# Patient Record
Sex: Female | Born: 1995 | Race: Black or African American | Hispanic: No | Marital: Single | Smoking: Never smoker
Health system: Southern US, Community
[De-identification: ages and names within clinical notes are randomized; demographics above are authoritative.]

---
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
## 2015-01-03 ENCOUNTER — Emergency Department: Payer: Medicaid Other

## 2015-01-03 ENCOUNTER — Emergency Department
Admission: EM | Admit: 2015-01-03 | Discharge: 2015-01-04 | Disposition: A | Discharge status: Home / Self Care | Payer: Medicaid Other | Attending: Emergency Medicine | Admitting: Emergency Medicine

## 2015-01-03 DIAGNOSIS — Y998 Other external cause status: Secondary | ICD-10-CM | POA: Diagnosis not present

## 2015-01-03 DIAGNOSIS — S99912A Unspecified injury of left ankle, initial encounter: Secondary | ICD-10-CM | POA: Diagnosis present

## 2015-01-03 DIAGNOSIS — S93402A Sprain of unspecified ligament of left ankle, initial encounter: Secondary | ICD-10-CM | POA: Insufficient documentation

## 2015-01-03 DIAGNOSIS — Y9289 Other specified places as the place of occurrence of the external cause: Secondary | ICD-10-CM | POA: Insufficient documentation

## 2015-01-03 DIAGNOSIS — Y9301 Activity, walking, marching and hiking: Secondary | ICD-10-CM | POA: Insufficient documentation

## 2015-01-03 DIAGNOSIS — W010XXA Fall on same level from slipping, tripping and stumbling without subsequent striking against object, initial encounter: Secondary | ICD-10-CM | POA: Insufficient documentation

## 2015-01-03 NOTE — ED Notes (Signed)
Pt states while [place] the beach [place] ago she twisted her left ankle, pt has swelling to the left ankle and foot. Pt points to her left ankle when asked where her pain is

## 2015-01-04 ENCOUNTER — Encounter: Payer: Self-pay | Admitting: Emergency Medicine

## 2015-01-04 NOTE — Discharge Instructions (Signed)

## 2015-01-04 NOTE — ED Provider Notes (Signed)
[facility] Emergency Department Provider Note  ____________________________________________  Time seen: 00:40  I have reviewed the triage vital signs and the nursing notes.   HISTORY  Chief Complaint Ankle Pain     HPI [name] is a 19 y.o. female who was walking on the beach on Friday.  She reports she tripped and developed pain in her left ankle. She reports she had some swelling in the left ankle. She is ambulatory but with discomfort since the fall. Her pain is moderate. She has no ecchymosis or erythema.     History reviewed. No pertinent past medical history.  There are no active problems to display for this patient.   History reviewed. No pertinent past surgical history.  No current outpatient prescriptions on file.  Allergies Fish allergy; Motrin; Peanut oil; and Yeast-related products  No family history on file.  Social History History  Substance Use Topics  . Smoking status: Never Smoker   . Smokeless tobacco: Not on file  . Alcohol Use: No    Review of Systems  Constitutional: Negative for fever. ENT: Negative for sore throat. Cardiovascular: Negative for chest pain. Respiratory: Negative for shortness of breath. Gastrointestinal: Negative for abdominal pain, vomiting and diarrhea. Genitourinary: Negative for dysuria. Musculoskeletal: Pain and mild swelling to the left ankle. See history of present illness Skin: Negative for rash. Neurological: Negative for headaches   10-point ROS otherwise negative.  ____________________________________________   PHYSICAL EXAM:  VITAL SIGNS: ED Triage Vitals  Enc Vitals Group     BP 01/03/15 2232 151/65 mmHg     Pulse Rate 01/03/15 2232 75     Resp 01/03/15 2232 16     Temp 01/03/15 2232 98.1 F (36.7 C)     Temp Source 01/03/15 2232 Oral     SpO2 01/03/15 2232 100 %     Weight 01/03/15 2232 140 lb (63.504 kg)     Height 01/03/15 2232  (1.6 m)     Head Cir --       Peak Flow --      Pain Score 01/03/15 2233 8     Pain Loc --      Pain Edu? --      Excl. in GC? --     Constitutional: Alert and oriented. Well appearing and in no distress. ENT   Head: Normocephalic and atraumatic. Cardiovascular: Normal rate, regular rhythm. Respiratory: Normal respiratory effort without tachypnea. Breath sounds are clear and equal bilaterally. No wheezes/rales/rhonchi. Gastrointestinal: Soft and nontender. No distention.  Back: No muscle spasm, no tenderness, no CVA tenderness. Musculoskeletal: Mild swelling to the lateral malleolus of her left ankle. There is mild tenderness over the malleolus. No deformity. Good range of motion.. Neurologic:  Normal speech and language. No gross focal neurologic deficits are appreciated.  Skin:  Skin is warm, dry. No rash noted. Psychiatric: Mood and affect are normal. Speech and behavior are normal.  ____________________________________________   ____________________________________________    RADIOLOGY  Left ankle  IMPRESSION: No evidence of fracture or dislocation.  ____________________________________________ ____________________________________________   INITIAL IMPRESSION / ASSESSMENT AND PLAN / ED COURSE  Minor sprain of left ankle. Will apply a brace. I counseled her on physical therapy activity at home. I have counseled her on icing the ankle after she has worked. Her work shifts or 4-6 hours. I think this is a reasonable timeframe for her to be on her feet.  ____________________________________________   FINAL CLINICAL IMPRESSION(S) / ED DIAGNOSES  Final diagnoses:  Ankle sprain, left, initial encounter      Darien Ramus, MD 01/04/15 [phone]

## 2016-05-20 ENCOUNTER — Emergency Department
Admission: EM | Admit: 2016-05-20 | Discharge: 2016-05-21 | Disposition: A | Discharge status: Home / Self Care | Payer: Medicaid - Out of State | Attending: Emergency Medicine | Admitting: Emergency Medicine

## 2016-05-20 ENCOUNTER — Emergency Department: Payer: Self-pay

## 2016-05-20 DIAGNOSIS — N3000 Acute cystitis without hematuria: Secondary | ICD-10-CM | POA: Insufficient documentation

## 2016-05-20 NOTE — ED Provider Notes (Signed)
Physician/Midlevel provider first contact with patient: 05/20/16 2347         History     Chief Complaint   Patient presents with   . Dysuria     Per patient, burning and discomfort w/ urination starting today w/ nausea, no vomiting.  Lower abdominal cramping, no pain.  Denies fever.      The history is provided by the patient. No language interpreter was used.   Dysuria   Pain quality:  Burning  Pain severity:  Moderate  Onset quality:  Gradual  Duration:  1 day  Timing:  Constant  Progression:  Unchanged  Chronicity:  New  Ineffective treatments:  None tried  Urinary symptoms: frequent urination and hesitancy    Associated symptoms: nausea    Associated symptoms: no abdominal pain, no fever, no flank pain and no vomiting             History reviewed. No pertinent past medical history.    History reviewed. No pertinent surgical history.    History reviewed. No pertinent family history.    Social  Social History   Substance Use Topics   . Smoking status: Never Smoker   . Smokeless tobacco: Never Used   . Alcohol use No       .     Allergies   Allergen Reactions   . Motrin [Ibuprofen] Swelling   . Peanut Oil Swelling       Home Medications     None on File           Review of Systems   Constitutional: Negative for fever.   Gastrointestinal: Positive for nausea. Negative for abdominal pain and vomiting.   Genitourinary: Positive for dysuria. Negative for flank pain.   All other systems reviewed and are negative.      Physical Exam    BP: 126/77, Heart Rate: 90, Temp: 98.8 F (37.1 C), Resp Rate: 18, SpO2: 99 %, Weight: 66 kg    Physical Exam   Constitutional: She is oriented to person, place, and time. She appears well-developed and well-nourished. No distress.   HENT:   Head: Normocephalic and atraumatic.   Nose: Nose normal.   Mouth/Throat: Oropharynx is clear and moist.   Eyes: Conjunctivae and EOM are normal. Pupils are equal, round, and reactive to light.   Neck: Normal range of motion. Neck supple.    Cardiovascular: Normal rate, regular rhythm, normal heart sounds and intact distal pulses.    Pulmonary/Chest: Effort normal and breath sounds normal. No respiratory distress.   Abdominal: Soft. She exhibits no distension. There is tenderness.   Mild suprapubic TTP  No RLQ TTP  No CVA TTP   Musculoskeletal: Normal range of motion. She exhibits no edema.   Neurological: She is alert and oriented to person, place, and time. No cranial nerve deficit. She exhibits normal muscle tone. Coordination normal.   Skin: Skin is warm and dry. No rash noted.   Psychiatric: She has a normal mood and affect. Her behavior is normal. Judgment normal.   Nursing note and vitals reviewed.        MDM and ED Course     ED Medication Orders     Start Ordered     Status Ordering Provider    05/21/16 0004 05/21/16 0004  acetaminophen (TYLENOL) tablet 1,000 mg  Once     Route: Oral  Ordered Dose: 1,000 mg     Last MAR action:  Given Bobbye Petti G  05/21/16 0004 05/21/16 0004  phenazopyridine (PYRIDIUM) tablet 200 mg  Once     Route: Oral  Ordered Dose: 200 mg     Last MAR action:  Given Harvy Riera G    05/21/16 0004 05/21/16 0004  cephalexin (KEFLEX) capsule 500 mg  Once     Route: Oral  Ordered Dose: 500 mg     Last MAR action:  Given Akshat Minehart G             MDM  Number of Diagnoses or Management Options  Acute cystitis without hematuria:   Diagnosis management comments: BP 126/77   Pulse 90   Temp 98.8 F (37.1 C) (Oral)   Resp 18   Ht 5\' 3"  (1.6 m)   Wt 66 kg   LMP 05/15/2016   SpO2 99%   BMI 25.77 kg/m     HDS, afebrile w/ exam, history and UA c/w UTI.  PO tolerant and well appearing.  No significant abdominal tenderness on exam.  Will send UCx and d/c with Rx and follow up.    Results for orders placed or performed during the hospital encounter of 05/20/16  -UA with reflex to micro (pts 3 + yrs)       Result                                            Value                         Ref Range                        Urine Type                                        Clean Catch                                                   Color, UA                                         Amber (A)                     Clear - Yellow                  Clarity, UA                                       Clear                         Clear - Hazy                    Specific Gravity UA  1.025                         1.001 - 1.035                   Urine pH                                          6.0                           5.0 - 8.0                       Leukocyte Esterase, UA                            LARGE (A)                     Negative                        Nitrite, UA                                       POSITIVE (A)                  Negative                        Protein, UR                                       >=500 (A)                     Negative                        Glucose, UA                                       NEGATIVE                      Negative                        Ketones UA                                        NEGATIVE                      Negative                        Urobilinogen, UA                                  4.0 (A)  0.2 - 2.0 mg/dL                 Bilirubin, UA                                     NEGATIVE                      Negative                        Blood, UA                                         LARGE (A)                     Negative                   -Urine HCG, Qualitative       Result                                            Value                         Ref Range                       Urine HCG Qualitative                             Negative                      Negative                   -Microscopic, Urine       Result                                            Value                         Ref Range                       RBC, UA                                           3 - 5                         0 - 5 /hpf                       WBC, UA  26 - 50 (A)                   0 - 5 /hpf                      Squamous Epithelial Cells, Urine                  26 - 50 (A)                   0 - 25 /hpf                  D/W patient ED course, treatment plan, lab/imaging results, discharge instructions and return precautions prior to discharge.  Patient understands and agrees with plan of care.         Amount and/or Complexity of Data Reviewed  Clinical lab tests: reviewed    Patient Progress  Patient progress: improved        ED Course              Procedures    Clinical Impression & Disposition     Clinical Impression  Final diagnoses:   Acute cystitis without hematuria        ED Disposition     ED Disposition Condition Date/Time Comment    Discharge  Sun May 21, 2016 12:49 AM Lysle Morales discharge to home/self care.    Condition at disposition: Stable           New Prescriptions    CEPHALEXIN (KEFLEX) 500 MG CAPSULE    Take 1 capsule (500 mg total) by mouth 4 (four) times daily.for 7 days    ONDANSETRON (ZOFRAN-ODT) 4 MG DISINTEGRATING TABLET    Take 1 tablet (4 mg total) by mouth every 6 (six) hours as needed for Nausea.    PHENAZOPYRIDINE (PYRIDIUM) 200 MG TABLET    Take 1 tablet (200 mg total) by mouth 3 (three) times daily as needed (for pain or burning during urination).for up to 6 doses    TRAMADOL (ULTRAM) 50 MG TABLET    Take 1 tablet (50 mg total) by mouth every 6 (six) hours as needed for Pain.for up to 20 doses Do not drive or operate machinery                 Windell Hummingbird, DO  05/21/16 [phone]

## 2016-05-21 LAB — URINALYSIS
Bilirubin, UA: NEGATIVE
Glucose, UA: NEGATIVE
Ketones UA: NEGATIVE
Nitrite, UA: POSITIVE — AB
Protein, UR: >=500 — AB
Specific Gravity UA: 1.025 (ref 1.001–1.035)
Urine pH: 6 (ref 5.0–8.0)
Urobilinogen, UA: 4 mg/dL — AB

## 2016-05-21 LAB — URINE MICROSCOPIC

## 2016-05-21 LAB — URINE HCG QUALITATIVE: Urine HCG Qualitative: NEGATIVE

## 2016-05-21 MED ORDER — CEPHALEXIN 500 MG PO CAPS
500.0000 mg | ORAL_CAPSULE | Freq: Four times a day (QID) | ORAL | 0 refills | Status: AC
Start: 2016-05-21 — End: 2016-05-28

## 2016-05-21 MED ORDER — TRAMADOL HCL 50 MG PO TABS
50.0000 mg | ORAL_TABLET | Freq: Four times a day (QID) | ORAL | 0 refills | Status: DC | PRN
Start: 2016-05-21 — End: 2017-03-17

## 2016-05-21 MED ORDER — PHENAZOPYRIDINE HCL 200 MG PO TABS
200.0000 mg | ORAL_TABLET | Freq: Once | ORAL | Status: AC
Start: 2016-05-21 — End: 2016-05-21
  Administered 2016-05-21: 200 mg via ORAL
  Filled 2016-05-21: qty 1

## 2016-05-21 MED ORDER — CEPHALEXIN 500 MG PO CAPS
500.0000 mg | ORAL_CAPSULE | Freq: Once | ORAL | Status: AC
Start: 2016-05-21 — End: 2016-05-21
  Administered 2016-05-21: 500 mg via ORAL
  Filled 2016-05-21: qty 1

## 2016-05-21 MED ORDER — ONDANSETRON 4 MG PO TBDP
4.0000 mg | ORAL_TABLET | Freq: Four times a day (QID) | ORAL | 0 refills | Status: DC | PRN
Start: 2016-05-21 — End: 2017-03-17

## 2016-05-21 MED ORDER — PHENAZOPYRIDINE HCL 200 MG PO TABS
200.0000 mg | ORAL_TABLET | Freq: Three times a day (TID) | ORAL | 0 refills | Status: DC | PRN
Start: 2016-05-21 — End: 2017-03-17

## 2016-05-21 MED ORDER — ACETAMINOPHEN 500 MG PO TABS
1000.0000 mg | ORAL_TABLET | Freq: Once | ORAL | Status: AC
Start: 2016-05-21 — End: 2016-05-21
  Administered 2016-05-21: 1000 mg via ORAL
  Filled 2016-05-21: qty 2

## 2016-05-21 NOTE — Discharge Instructions (Signed)
Urinary Tract Infection    You have been diagnosed with a lower urinary tract infection (UTI). This is also called cystitis.    Cystitis is an infection in your bladder. Your doctor diagnosed it by testing your urine. Cystitis usually causes burning with urination or frequent urination. It might make you feel like you have to urinate even when you don't.     Cystitis is usually treated with antibiotics and medicine to help with pain.    It is VERY IMPORTANT that you fill your prescription and take all of the antibiotics as directed. If a lower urinary tract infection goes untreated for too long, it can become a kidney infection.    FOR WOMEN: To reduce the risk of getting cystitis again:   Always urinate before and after sexual intercourse.   Always wipe from front to back after urinating or having a bowel movement. Do not wipe from back to front.   Drink plenty of fluids. Try to drink cranberry or blueberry juice. These juices have a chemical that stops bacteria from "sticking" to the bladder.    YOU SHOULD SEEK MEDICAL ATTENTION IMMEDIATELY, EITHER HERE OR AT THE NEAREST EMERGENCY DEPARTMENT, IF ANY OF THE FOLLOWING OCCURS:   You have a fever (temperature higher than 100.4F / 38C) or shaking chills.   You feel nauseated or vomit.   You have pain in your side or back.   You don't get better after taking all of your antibiotics.   You have any new symptoms or concerns.   You feel worse or do not improve.

## 2016-05-21 NOTE — ED Notes (Signed)
Pt discharged awake alert not in any distress.Pt instructed to to follow up to PCP and reviewed all the medication and we;ll understood

## 2016-05-21 NOTE — ED Triage Notes (Signed)
Pt stated that she started having pain on urination and tonight claimed she is urinating with stinge of blood denies any nausea no vomiting and fever

## 2017-03-17 ENCOUNTER — Emergency Department
Admission: EM | Admit: 2017-03-17 | Discharge: 2017-03-17 | Disposition: A | Discharge status: Home / Self Care | Payer: Medicaid HMO | Attending: Emergency Medicine | Admitting: Emergency Medicine

## 2017-03-17 DIAGNOSIS — R1030 Lower abdominal pain, unspecified: Secondary | ICD-10-CM

## 2017-03-17 DIAGNOSIS — N898 Other specified noninflammatory disorders of vagina: Secondary | ICD-10-CM | POA: Insufficient documentation

## 2017-03-17 DIAGNOSIS — N3090 Cystitis, unspecified without hematuria: Secondary | ICD-10-CM | POA: Insufficient documentation

## 2017-03-17 LAB — URINALYSIS
Bilirubin, UA: NEGATIVE
Blood, UA: NEGATIVE
Glucose, UA: NEGATIVE
Ketones UA: NEGATIVE
Nitrite, UA: NEGATIVE
Protein, UR: NEGATIVE
Specific Gravity UA: 1.01 (ref 1.001–1.035)
Urine pH: 7 (ref 5.0–8.0)
Urobilinogen, UA: <2 mg/dL

## 2017-03-17 LAB — URINE HCG QUALITATIVE: Urine HCG Qualitative: NEGATIVE

## 2017-03-17 LAB — URINE MICROSCOPIC: RBC, UA: 0 /hpf (ref 0–5)

## 2017-03-17 MED ORDER — METRONIDAZOLE 500 MG PO TABS
500.0000 mg | ORAL_TABLET | Freq: Two times a day (BID) | ORAL | 0 refills | Status: AC
Start: 2017-03-17 — End: 2017-03-24

## 2017-03-17 MED ORDER — CEPHALEXIN 500 MG PO CAPS
500.0000 mg | ORAL_CAPSULE | Freq: Three times a day (TID) | ORAL | 0 refills | Status: AC
Start: 2017-03-17 — End: 2017-03-24

## 2017-03-17 MED ORDER — CEPHALEXIN 500 MG PO CAPS
500.0000 mg | ORAL_CAPSULE | Freq: Once | ORAL | Status: AC
Start: 2017-03-17 — End: 2017-03-17
  Administered 2017-03-17: 500 mg via ORAL
  Filled 2017-03-17: qty 1

## 2017-03-17 MED ORDER — METRONIDAZOLE 250 MG PO TABS
500.0000 mg | ORAL_TABLET | Freq: Once | ORAL | Status: AC
Start: 2017-03-17 — End: 2017-03-17
  Administered 2017-03-17: 500 mg via ORAL
  Filled 2017-03-17: qty 2

## 2017-03-17 MED ORDER — ACETAMINOPHEN 325 MG PO TABS
650.0000 mg | ORAL_TABLET | Freq: Once | ORAL | Status: AC
Start: 2017-03-17 — End: 2017-03-17
  Administered 2017-03-17: 650 mg via ORAL
  Filled 2017-03-17: qty 2

## 2017-03-17 NOTE — Discharge Instructions (Signed)
Vaginal Discharge    You have been diagnosed with a vaginal discharge.    Discharge (drainage) from the vagina often means there is an inflammation in the vagina. This is called "vaginitis." Some other vaginitis symptoms are: Itching, burning and odor. Urinating (peeing) may be uncomfortable.    The most common causes were checked during the evaluation today. However, other possible causes may need further evaluation by your family doctor or gynecologist.    Vaginal discharge and vaginitis have many causes. Some are:   Hormonal changes from menopause in older women.   Yeast Infections (Candida vaginitis): This happens when yeast normally living in the vagina grows out of control. The normal (good) bacteria living in the vagina can be killed or weakened. This allows the yeast to overgrow. This can happen if you used an antibiotic recently. Recent illness and long-term diabetes can weaken the immune system. This means your body may not be able to keep the yeast under control.   Nonspecific vaginitis: This results from abnormal bacterial overgrowth of the bacteria that usually live in the vagina. This may be caused by recent illness. It can also be caused by some medicines and douching. Gardnerella vaginitis (also called "Bacterial Vaginosis") is a common infection that may result from overgrowth. Rarely, Gardnerella may be a sexually transmitted disease.   Sexually Transmitted Diseases: Some STDs cause a lot of frothy discharge that smells very bad and is green. Trichomoniasis ("trich") is one such STD. Chlamydia can also cause discharge. However, it may have little or no symptoms of which you are aware. Chlamydia may cause a severe (serious) disease called Pelvic Inflammatory Disease (PID). It may also cause the reproductive organs to scar, which causes chronic (ongoing) pelvic pain and infertility (the inability to have babies). Gonorrhea "GC" may cause heavy purulent (pus-like) discharge. It can also cause  PID. Most STDs pass easily between sexual partners. You and your sexual partner(s) would need to be treated.   Viral vaginitis: Herpes Simplex is often the cause. This causes a thin, watery discharge. It is linked with painful blisters on the vagina and swelling of the vaginal lips. Urination and sexual intercourse (sex) may be painful. It is a very infectious STD. Symptoms can be treated but there is no cure.   Allergic vaginitis: The vagina gets irritated from coming into contact with certain substances. These include latex condoms, spermicidal jellies, soaps and bubble baths. It also includes vaginal douche products.    If it is confirmed or suspected that you have an STD, tell your sexual partner(s). Then your partner can be checked by a doctor or the local health department.    To protect yourself and your sexual partner from STDs, use a condom during intercourse (sex), especially if your diagnosis is uncertain and you are waiting for the results of pelvic cultures done during this visit.    The health care provider who saw you feels it is OK to send you home.    You may need to return here or go to the nearest Emergency Department if more symptoms develop. This might mean that you are having complications like worsening infection or bleeding. There could also be some other undiagnosed problem.    It is important to have a follow-up. See your gynecologist or family doctor in the next few days. The medical staff may have told your doctor about this visit. If not, be sure to do so yourself.   If you don't have the right follow-up care available,   tell members of the medical staff before you go. They can help make arrangements for you.    YOU SHOULD SEEK MEDICAL ATTENTION IMMEDIATELY, EITHER HERE OR AT THE NEAREST EMERGENCY DEPARTMENT, IF ANY OF THE FOLLOWING OCCURS:   Increasing pain in the abdomen (belly), pelvis or back.   Large amounts of vaginal discharge or bleeding, passing large blood  clots.   Fever (temperature higher than 100.4F / 38C), chills, nausea, vomiting (throwing up).   You are dizzy, lightheaded, or pass out.             Urinary Tract Infection, Lower    You have been diagnosed with an uncomplicated lower urinary tract infection (UTI).     A UTI is an infection in your bladder. Your doctor diagnosed it by testing your urine. UTIs usually causes burning with urination (peeing) or frequent urination. It might make you feel like you have to urinate even when you don't.     UTI is usually treated with antibiotics and medicine to help with pain.    It is VERY IMPORTANT that you fill your prescription and take all of the antibiotics as directed. If a lower urinary tract infection goes untreated for too long, it can become a kidney infection.    FOR WOMEN: To reduce the risk of getting UTIs again:   Always urinate before and after sexual intercourse.   Always wipe from front to back after urinating or having a bowel movement. Do not wipe from back to front.   Drink plenty of fluids. Try to drink cranberry or blueberry juice. These juices have a chemical that stops bacteria from sticking to the bladder.    YOU SHOULD SEEK MEDICAL ATTENTION IMMEDIATELY, EITHER HERE OR AT THE NEAREST EMERGENCY DEPARTMENT, IF ANY OF THE FOLLOWING OCCURS:   You have a fever (temperature higher than 100.4F / 38C) or shaking chills.   You feel nauseated or vomit.   You have pain in your side or back.   You don't get better after taking all of your antibiotics.   You have any new symptoms or concerns.   You feel worse or do not improve.    If you can't follow up with your doctor, or if at any time you feel you need to be rechecked or seen again, come back here or go to the nearest emergency department.

## 2017-03-17 NOTE — ED Triage Notes (Signed)
Patient c/o abdominal pain onset yesterday. Patient denies N/V with regular bowel movement.

## 2017-03-17 NOTE — ED Provider Notes (Signed)
EMERGENCY DEPARTMENT NOTE    Physician/Midlevel provider first contact with patient: 03/17/17 1030         HISTORY OF PRESENT ILLNESS   Historian:Patient  Translator Used: No    Chief Complaint: Abdominal Pain       21 y.o. female hx of UTI who presents with vaginal discharge and lower abdominal discomfort. Pt reports onset of symptoms was yesterday. Pt reports no dysuria. Noted lower abdominal intermittent discomfort. No nausea or vomiting. Noted vaginal discharge. Reports she is sexually active. Prior hx of BV. Does not use protection. LMP was 8/15.      1. Location of symptoms: GU  2. Onset of symptoms: yesterday   3. What was patient doing when symptoms started (Context): see above  4. Severity: moderate  5. Timing: constant   6. Activities that worsen symptoms: n/a  7. Activities that improve symptoms: n/a  8. Quality: aching, discharge   9. Radiation of symptoms: no  10. Associated signs and Symptoms: see above  11. Are symptoms worsening? yes  MEDICAL HISTORY     Past Medical History:  History reviewed. No pertinent past medical history.    Past Surgical History:  History reviewed. No pertinent surgical history.    Social History:  Social History     Social History   . Marital status: Single     Spouse name: N/A   . Number of children: N/A   . Years of education: N/A     Occupational History   . Not on file.     Social History Main Topics   . Smoking status: Never Smoker   . Smokeless tobacco: Never Used   . Alcohol use No   . Drug use: No   . Sexual activity: Not on file     Other Topics Concern   . Not on file     Social History Narrative   . No narrative on file       Family History:  History reviewed. No pertinent family history.    Outpatient Medication:  Discharge Medication List as of 03/17/2017 11:08 AM            REVIEW OF SYSTEMS   Review of Systems  Constitutional: Negative for fever and chills.   HENT: Negative for congestion and sore throat.  Eyes: Negative for eye discharge and eye redness.    Respiratory: Negative for cough and sputum production.    Gastrointestinal: Positive for lower abdominal pain   Genitourinary:  Positive for vaginal discharge   Neurological: Negative for dizziness, focal weakness, numbness.   All other systems negative.  PHYSICAL EXAM     ED Triage Vitals [03/17/17 1037]   Enc Vitals Group      BP       Pulse       Resp       Temp       Temp src       SpO2       Weight       Height       Head Circumference       Peak Flow       Pain Score 7      Pain Loc       Pain Edu?       Excl. in GC?        Constitutional: Vital signs reviewed. Well appearing, well hydrated, well perfused, non-toxic appearing, no apparent distress  Head:  Normocephalic, atraumatic  Eyes: PERRL, normal conjunctiva  bilaterally, EOMI  ENT: Mucous membranes moist.  .  Neck: Normal range of motion. Non-tender.   Respiratory/Chest: clear to auscultation. No work of breathing. No tachypnea..  Cardiovascular: Regular rate and rhythm. No murmur.   Abdomen: tenderness in the lower abdomen to deep palpation, no rebound.Marland Kitchen  UpperExtremity: No edema or cyanosis.  Neurological: Awake and alert. No focal motor deficits by observation.  Skin: Warm and dry. No rash.  Lymphatic: No cervical lymphadenopathy.    MEDICAL DECISION MAKING       21 y.o. female hx of UTI who presents with vaginal discharge and lower abdominal discomfort.      Discussed testing for STIs. Pt elected for treatment for BV     Urine gc/chlamydia sent   rx for keflex for UTI, urine cx sent     Outpatient follow-up with primary physician         DISCUSSION        Vital Signs: Reviewed the patient?s vital signs.   Nursing Notes: Reviewed and utilized available nursing notes.  Medical Records Reviewed: Reviewed available past medical records.  Counseling: The emergency provider has spoken with the patient and discussed today?s findings, in addition to providing specific details for the plan of care.  Questions are answered and there is agreement with the  plan.      CARDIAC STUDIES    The following cardiac studies were independently interpreted by the Emergency Medicine Physician.  For full cardiac study results please see chart.    Monitor Strip  Interpreted by ED Physician  Rate: 80  Rhythm: NSR   ST Changes: none    IMAGING STUDIES    The following imaging studies were independently interpreted by the Emergency Medicine Physician.  For full imaging study results please see chart.      PULSE OXIMETRY    Oxygen Saturation by Pulse Oximetry: 100%  Interventions: none  Interpretation:  Normal     EMERGENCY DEPT. MEDICATIONS      ED Medication Orders     Start Ordered     Status Ordering Provider    03/17/17 1108 03/17/17 1107  acetaminophen (TYLENOL) tablet 650 mg  Once     Route: Oral  Ordered Dose: 650 mg     Last MAR action:  Given ADJEI-TWUM, Mikesha Migliaccio    03/17/17 1106 03/17/17 1105  metroNIDAZOLE (FLAGYL) tablet 500 mg  Once     Route: Oral  Ordered Dose: 500 mg     Last MAR action:  Given ADJEI-TWUM, Asyah Candler    03/17/17 1105 03/17/17 1104  cephalexin (KEFLEX) capsule 500 mg  Once     Route: Oral  Ordered Dose: 500 mg     Last MAR action:  Given ADJEI-TWUM, Emelie Newsom          LABORATORY RESULTS    Ordered and independently interpreted AVAILABLE laboratory tests. Please see results section in chart for full details.  Results for orders placed or performed during the hospital encounter of 03/17/17   Urinalysis   Result Value Ref Range    Urine Type Clean Catch     Color, UA Straw Clear - Yellow    Clarity, UA Clear Clear - Hazy    Specific Gravity UA 1.010 1.001 - 1.035    Urine pH 7.0 5.0 - 8.0    Leukocyte Esterase, UA LARGE (A) Negative    Nitrite, UA NEGATIVE Negative    Protein, UR NEGATIVE Negative    Glucose, UA NEGATIVE Negative    Ketones UA  NEGATIVE Negative    Urobilinogen, UA <2.0 0.2 - 2.0 mg/dL    Bilirubin, UA NEGATIVE Negative    Blood, UA NEGATIVE Negative   Urine HCG Qualitative   Result Value Ref Range    Urine HCG Qualitative Negative Negative    Microscopic, Urine   Result Value Ref Range    RBC, UA 0 -2 0 - 5 /hpf    WBC, UA 26 - 50 (A) 0 - 5 /hpf    Squamous Epithelial Cells, Urine 11 - 25 0 - 25 /hpf           DIAGNOSIS      Diagnosis:  Final diagnoses:   Cystitis   Lower abdominal pain   Vaginal discharge       Disposition:  ED Disposition     ED Disposition Condition Date/Time Comment    Discharge  Sat Mar 17, 2017 11:08 AM Carla Wood discharge to home/self care.    Condition at disposition: Stable          Prescriptions:  Discharge Medication List as of 03/17/2017 11:08 AM      START taking these medications    Details   cephalexin (KEFLEX) 500 MG capsule Take 1 capsule (500 mg total) by mouth 3 (three) times daily.for 7 days, Starting Sat 03/17/2017, Until Sat 03/24/2017, Normal      metroNIDAZOLE (FLAGYL) 500 MG tablet Take 1 tablet (500 mg total) by mouth 2 (two) times daily.for 7 days, Starting Sat 03/17/2017, Until Sat 03/24/2017, Normal                  Adjei-Twum, Mikey College, MD  03/17/17 1150

## 2017-03-21 LAB — URINE CHLAMYDIA/NEISSERIA BY PCR
Chlamydia DNA by PCR: POSITIVE — AB
Neisseria gonorrhoeae by PCR: NEGATIVE

## 2017-03-22 ENCOUNTER — Telehealth: Payer: Self-pay

## 2017-03-22 NOTE — Telephone Encounter (Signed)
Attempted to call patient to advise her of positive Chlamydia results.  Phone number I called stated that it was not accepting incoming calls.  Will have a certified letter sent to patient.

## 2017-03-23 MED ORDER — DOXYCYCLINE HYCLATE 100 MG PO TABS
100.0000 mg | ORAL_TABLET | Freq: Two times a day (BID) | ORAL | 0 refills | Status: AC
Start: 2017-03-23 — End: 2017-03-30

## 2018-03-10 ENCOUNTER — Other Ambulatory Visit: Payer: Self-pay

## 2018-03-10 ENCOUNTER — Encounter: Payer: Self-pay | Admitting: Emergency Medicine

## 2018-03-10 DIAGNOSIS — M545 Low back pain: Secondary | ICD-10-CM | POA: Insufficient documentation

## 2018-03-10 DIAGNOSIS — Y939 Activity, unspecified: Secondary | ICD-10-CM | POA: Diagnosis not present

## 2018-03-10 DIAGNOSIS — R51 Headache: Secondary | ICD-10-CM | POA: Insufficient documentation

## 2018-03-10 DIAGNOSIS — Z5321 Procedure and treatment not carried out due to patient leaving prior to being seen by health care provider: Secondary | ICD-10-CM | POA: Diagnosis not present

## 2018-03-10 DIAGNOSIS — Y998 Other external cause status: Secondary | ICD-10-CM | POA: Insufficient documentation

## 2018-03-10 DIAGNOSIS — Y92410 Unspecified street and highway as the place of occurrence of the external cause: Secondary | ICD-10-CM | POA: Diagnosis not present
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
## 2018-03-10 NOTE — ED Notes (Signed)
Pt ambulated with steady gait to stat registration inquiring about wait time; pt understands she will be taken back as soon as a treatment room is available for her; explained ED procedure and she verbalized understanding;

## 2018-03-11 ENCOUNTER — Emergency Department
Admission: EM | Admit: 2018-03-11 | Discharge: 2018-03-11 | Disposition: A | Discharge status: Home / Self Care | Payer: No Typology Code available for payment source | Source: Home / Self Care | Attending: Emergency Medicine | Admitting: Emergency Medicine

## 2018-03-11 ENCOUNTER — Encounter: Payer: Self-pay | Admitting: Emergency Medicine

## 2018-03-11 ENCOUNTER — Emergency Department
Admission: EM | Admit: 2018-03-11 | Discharge: 2018-03-11 | Disposition: A | Discharge status: Home / Self Care | Payer: No Typology Code available for payment source | Attending: Emergency Medicine | Admitting: Emergency Medicine

## 2018-03-11 ENCOUNTER — Emergency Department: Payer: No Typology Code available for payment source

## 2018-03-11 ENCOUNTER — Other Ambulatory Visit: Payer: Self-pay

## 2018-03-11 DIAGNOSIS — S20211A Contusion of right front wall of thorax, initial encounter: Secondary | ICD-10-CM

## 2018-03-11 DIAGNOSIS — Y9389 Activity, other specified: Secondary | ICD-10-CM | POA: Insufficient documentation

## 2018-03-11 DIAGNOSIS — S161XXA Strain of muscle, fascia and tendon at neck level, initial encounter: Secondary | ICD-10-CM

## 2018-03-11 DIAGNOSIS — Y92410 Unspecified street and highway as the place of occurrence of the external cause: Secondary | ICD-10-CM

## 2018-03-11 DIAGNOSIS — Y999 Unspecified external cause status: Secondary | ICD-10-CM

## 2018-03-11 MED ORDER — CYCLOBENZAPRINE HCL 5 MG PO TABS: 5.0000 mg | ORAL_TABLET | Freq: Three times a day (TID) | ORAL | 0 refills | Status: AC | PRN

## 2018-03-11 MED ORDER — NAPROXEN 500 MG PO TABS: 500.0000 mg | ORAL_TABLET | Freq: Two times a day (BID) | ORAL | 0 refills | Status: AC

## 2018-03-11 MED ORDER — NAPROXEN 500 MG PO TABS
500.0000 mg | ORAL_TABLET | Freq: Once | ORAL | Status: AC
  Administered 2018-03-11: 500 mg via ORAL
  Filled 2018-03-11: qty 1
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
## 2018-03-11 NOTE — Discharge Instructions (Addendum)
Please take medications as prescribed as needed.  Apply ice to the ribs and neck muscles 20 minutes every hour for the next couple days then transition over to eat.  If no improvement 1 week follow-up with orthopedics.

## 2018-09-27 ENCOUNTER — Emergency Department (HOSPITAL_COMMUNITY)
Admission: EM | Admit: 2018-09-27 | Discharge: 2018-09-28 | Disposition: A | Discharge status: Home / Self Care | Payer: Medicaid - Out of State | Attending: Emergency Medicine | Admitting: Emergency Medicine

## 2018-09-27 ENCOUNTER — Other Ambulatory Visit: Payer: Self-pay

## 2018-09-27 ENCOUNTER — Encounter (HOSPITAL_COMMUNITY): Payer: Self-pay | Admitting: Emergency Medicine

## 2018-09-27 DIAGNOSIS — T783XXA Angioneurotic edema, initial encounter: Secondary | ICD-10-CM | POA: Insufficient documentation

## 2018-09-27 DIAGNOSIS — Z91010 Allergy to peanuts: Secondary | ICD-10-CM | POA: Insufficient documentation

## 2018-09-27 DIAGNOSIS — Z79899 Other long term (current) drug therapy: Secondary | ICD-10-CM | POA: Insufficient documentation

## 2018-09-27 DIAGNOSIS — T7840XA Allergy, unspecified, initial encounter: Secondary | ICD-10-CM

## 2018-09-27 LAB — I-STAT BETA HCG BLOOD, ED (MC, WL, AP ONLY): I-stat hCG, quantitative: <5 m[IU]/mL (ref <5)

## 2018-09-27 MED ORDER — METHYLPREDNISOLONE SODIUM SUCC 125 MG IJ SOLR
125.0000 mg | Freq: Once | INTRAMUSCULAR | Status: AC
  Administered 2018-09-27: 125 mg via INTRAVENOUS
  Filled 2018-09-27: qty 2

## 2018-09-27 MED ORDER — FAMOTIDINE 20 MG IN NS 100 ML IVPB
20.0000 mg | Freq: Once | INTRAVENOUS | Status: AC
  Administered 2018-09-27: 20 mg via INTRAVENOUS
  Filled 2018-09-27: qty 100

## 2018-09-27 NOTE — ED Provider Notes (Signed)
[facility] EMERGENCY DEPARTMENT Provider Note   CSN: [id number] Arrival date & time: 09/27/18  2153    History   Chief Complaint Chief Complaint  Patient presents with   Allergic Reaction    HPI [name] is a 23 y.o. female with no significant past medical history who presents today for evaluation of allergic reaction.  She reports that at around 2 PM she ate at [facility].  She says that she has noticed that when she eats ranch dressing recently she feels slightly itchy after.  She states that she was feeling okay until about 8 PM when she started feeling like her lip was slightly swollen.  At about 930 she also started feeling like her throat was itchy.  She denies shortness of breath or rash.  No nausea vomiting or diarrhea.  She took 325 mg tablets of Benadryl approximately 1 hour prior to arrival. She does not take any blood pressure medications or ACE inhibitors.  No known family history of hereditary angioedema.    HPI  History reviewed. No pertinent past medical history.  There are no active problems to display for this patient.   History reviewed. No pertinent surgical history.   OB History   No obstetric history on file.      Home Medications    Prior to Admission medications   Medication Sig Start Date End Date Taking? Authorizing Provider  cyclobenzaprine (FLEXERIL) 5 MG tablet Take 1-2 tablets (5-10 mg total) by mouth 3 (three) times daily as needed for muscle spasms. 03/11/18   Evon Slack, PA-C  EPINEPHrine 0.3 mg/0.3 mL IJ SOAJ injection Inject one device into the side of the thigh for severe allergic reactions.  May repeat with second device in 5-10 minutes if needed.  After using call 9-11. 09/28/18   Cristina Gong, PA-C  naproxen (NAPROSYN) 500 MG tablet Take 1 tablet (500 mg total) by mouth 2 (two) times daily with a meal. 03/11/18 03/11/19  Evon Slack, PA-C  predniSONE (DELTASONE) 20 MG tablet Take 2 tablets  (40 mg total) by mouth daily for 4 days. 09/28/18 10/02/18  Cristina Gong, PA-C    Family History No family history on file.  Social History Social History   Tobacco Use   Smoking status: Never Smoker   Smokeless tobacco: Never Used  Substance Use Topics   Alcohol use: No   Drug use: Yes    Types: Marijuana    Comment: last smoked yesterday     Allergies   Fish allergy; Motrin [ibuprofen]; Peanut oil; and Yeast-related products   Review of Systems Review of Systems  Constitutional: Negative for chills and fever.  HENT: Positive for facial swelling. Negative for drooling, sore throat, trouble swallowing and voice change.   Respiratory: Negative for cough, chest tightness and shortness of breath.   Cardiovascular: Negative for chest pain.  Gastrointestinal: Negative for abdominal pain, diarrhea, nausea and vomiting.  Neurological: Negative for weakness and headaches.  All other systems reviewed and are negative.    Physical Exam Updated Vital Signs BP 123/87 (BP Location: Right Arm)    Pulse 74    Temp 98.3 F (36.8 C) (Oral)    Resp 16    Ht  (1.6 m)    Wt 63.5 kg    LMP 09/27/2018 (Exact Date)    SpO2 99%    BMI 24.80 kg/m   Physical Exam Vitals signs and nursing note reviewed.  Constitutional:  General: She is not in acute distress.    Appearance: She is well-developed.  HENT:     Head: Normocephalic and atraumatic.     Comments: There is obvious edema of the lower lip.  There is no edema of the upper lip.  Tongue is not swollen, voice is not hoarse or muffled.  She is managing her secretions without difficulty.  No obvious edema to oropharynx.    Nose: Nose normal.     Mouth/Throat:     Mouth: Mucous membranes are moist.     Pharynx: No oropharyngeal exudate or posterior oropharyngeal erythema.  Eyes:     Conjunctiva/sclera: Conjunctivae normal.  Neck:     Musculoskeletal: Normal range of motion and neck supple.  Cardiovascular:     Rate  and Rhythm: Normal rate and regular rhythm.     Pulses: Normal pulses.     Heart sounds: Normal heart sounds. No murmur.  Pulmonary:     Effort: Pulmonary effort is normal. No respiratory distress.     Breath sounds: Normal breath sounds.  Abdominal:     Palpations: Abdomen is soft.     Tenderness: There is no abdominal tenderness.  Musculoskeletal: Normal range of motion.     Right lower leg: No edema.     Left lower leg: No edema.  Lymphadenopathy:     Cervical: No cervical adenopathy.  Skin:    General: Skin is warm and dry.  Neurological:     General: No focal deficit present.     Mental Status: She is alert and oriented to person, place, and time.  Psychiatric:        Mood and Affect: Mood normal.      ED Treatments / Results  Labs (all labs ordered are listed, but only abnormal results are displayed) Labs Reviewed  I-STAT BETA HCG BLOOD, ED ([facility], [facility], [facility] ONLY)    EKG None  Radiology No results found.  Procedures Procedures (including critical care time)  Medications Ordered in ED Medications  methylPREDNISolone sodium succinate (SOLU-MEDROL) 125 mg/2 mL injection 125 mg (125 mg Intravenous Given 09/27/18 2233)  famotidine (PEPCID) IVPB 20 mg in NS 100 mL IVPB ( Intravenous Stopped 09/27/18 2308)     Initial Impression / Assessment and Plan / ED Course  I have reviewed the triage vital signs and the nursing notes.  Pertinent labs & imaging results that were available during my care of the patient were reviewed by me and considered in my medical decision making (see chart for details).  Clinical Course as of Sep 27 512  Fri Sep 27, 2018  2228 Patient re-evaluated, feeling sleepy from medications, reports that her throat is feeling "normal."  No new symptoms, will continue to monitor.   [EH]  2328 Patient re-evaluated, right sided lip swelling is starting to decrease.  No worsening or new symptoms.  In no distress.    [EH]  Sat Sep 28, 2018  0210 Patient  reevaluated, lip swelling is slightly decreased from arrival however still remains swollen.  She denies any new or worsening symptoms.   [EH]    Clinical Course User Index [EH] Cristina Gong, PA-C      Patient presents today for evaluation of lip swelling.  She does not take any ACE inhibitors, and has a history of allergic reactions to fish, and peanuts, so suspect that this is allergic reaction related.  She took 75 mg of Benadryl prior to arrival, therefore was not given any additional Benadryl.  She had significant lip swelling, however no tongue swelling and her airway was intact without secondary system involvement therefore she was not given EpiPen instead closely monitored.  She was treated with IV steroids and H2 inhibitors.  She was observed in the emergency room for over 4 hours without worsening of condition.  Her lip swelling slightly decreased.  Given that this is the first time this has happened to her, and her history of allergies and eating out suspect that this is allergy related rather than familial angioedema.  She is given prescription for continued steroids and prescription for EpiPen.  We went over the appropriate use of EpiPen's.    Return precautions were discussed with patient who states their understanding.  At the time of discharge patient denied any unaddressed complaints or concerns.  Patient is agreeable for discharge home.   Final Clinical Impressions(s) / ED Diagnoses   Final diagnoses:  Angioedema, initial encounter  Allergic reaction, initial encounter    ED Discharge Orders         Ordered    predniSONE (DELTASONE) 20 MG tablet  Daily     09/28/18 0212    EPINEPHrine 0.3 mg/0.3 mL IJ SOAJ injection     09/28/18 1324           Cristina Gong, PA-C 09/28/18 4010    Zadie Rhine, MD 09/28/18 [phone]

## 2018-09-27 NOTE — ED Triage Notes (Signed)
Pt to ED with c/o allergic reaction.  Pt st's she is allergic to a lot of things.   St's she ate at [facility] today approx 2:00 and then ate [employer] earlier tonight.  Pt st's she noticed her bottom lip start to swell.  Pt st's she took 3 Benadryl before coming to ED.  No resp distress present at this time

## 2018-09-28 MED ORDER — EPINEPHRINE 0.3 MG/0.3ML IJ SOAJ: INTRAMUSCULAR | 0 refills | Status: DC

## 2018-09-28 MED ORDER — PREDNISONE 20 MG PO TABS: 40.0000 mg | ORAL_TABLET | Freq: Every day | ORAL | 0 refills | Status: AC

## 2018-09-28 NOTE — Discharge Instructions (Signed)
Today you were seen and evaluated for an allergic reaction.  You were given multiple medications in the emergency room including benadryl (which may make you sleepy) and IV steroids.    If you develop any concerning symptoms, especially feeling like your throat is closing, shortness of breath, swelling of the face, lips or tongue then please return to the emergency room immediately for evaluation.    For the next 24 hours please take 1 pill (25 mg) of benadryl every 6 hours.  If you have mild symptoms such as itching, mild nausea you may take a second benadryl.  This will make you sleepy and you should not drive, operate heavy machinery or perform any task where if you were to fall asleep or make a slow decision it could cause harm to you or someone else.   You have also been given a prescription for 4 days of steroids.  Please take your first dose about 24 hours after you were given steroids in the emergency room.  Steroids may give you the feeling of being hot, increased appetite, and extra energy.    I have given you a prescription for two epi pens.  Please make sure you keep one with you at all times.  Please consider keeping two pills of benadryl with your epi pen and if you use it take the benadryl and call 9-11.  Please do not store epi-pens in your car or allow them to get very hot or cold as this can make them not work as well.

## 2018-09-28 NOTE — ED Notes (Addendum)
Patient verbalizes understanding of medications and discharge instructions. No further questions at this time. VSS and patient ambulatory at discharge.   

## 2019-02-15 ENCOUNTER — Other Ambulatory Visit: Payer: Self-pay

## 2019-02-15 ENCOUNTER — Encounter (HOSPITAL_COMMUNITY): Payer: Self-pay | Admitting: Emergency Medicine

## 2019-02-15 ENCOUNTER — Emergency Department (HOSPITAL_COMMUNITY): Payer: Self-pay

## 2019-02-15 ENCOUNTER — Emergency Department (HOSPITAL_COMMUNITY)
Admission: EM | Admit: 2019-02-15 | Discharge: 2019-02-15 | Disposition: A | Discharge status: Home / Self Care | Payer: Self-pay | Attending: Emergency Medicine | Admitting: Emergency Medicine

## 2019-02-15 DIAGNOSIS — M25562 Pain in left knee: Secondary | ICD-10-CM | POA: Insufficient documentation

## 2019-02-15 DIAGNOSIS — R600 Localized edema: Secondary | ICD-10-CM | POA: Insufficient documentation

## 2019-02-15 DIAGNOSIS — X501XXA Overexertion from prolonged static or awkward postures, initial encounter: Secondary | ICD-10-CM | POA: Insufficient documentation

## 2019-02-15 DIAGNOSIS — Z91010 Allergy to peanuts: Secondary | ICD-10-CM | POA: Insufficient documentation

## 2019-02-15 DIAGNOSIS — Z79899 Other long term (current) drug therapy: Secondary | ICD-10-CM | POA: Insufficient documentation

## 2019-02-15 MED ORDER — DICLOFENAC SODIUM 1 % TD GEL: 2.0000 g | Freq: Four times a day (QID) | TRANSDERMAL | 0 refills | Status: DC

## 2019-02-15 MED ORDER — DICLOFENAC SODIUM 1 % TD GEL: 2.0000 g | Freq: Four times a day (QID) | TRANSDERMAL | 0 refills | Status: AC

## 2019-02-15 NOTE — ED Provider Notes (Signed)
[place] EMERGENCY DEPARTMENT Provider Note   CSN: [id number] Arrival date & time: 02/15/19  0342    History   Chief Complaint Chief Complaint  Patient presents with  . Knee Pain    HPI [name] is a 23 y.o. female with a hx of no major medical problems presents to the Emergency Department complaining of waxing and waning left knee pain and swelling.  She reports symptoms have been present for approximately 2 weeks.  She reports no known injury to her knee.  She does states she fell down some stairs in April and injured her ankle but did not have pain in this knee.  Patient works several jobs which includes a lot of standing on her feet.  She has been taking ibuprofen with minimal relief and started using a knee sleeve today.  Standing for long periods of time does make her pain worse, rest does make it better.  She denies redness, fevers, increased warmth, decreased range of motion, history of STD.      The history is provided by the patient and medical records. No language interpreter was used.    History reviewed. No pertinent past medical history.  There are no active problems to display for this patient.   History reviewed. No pertinent surgical history.   OB History   No obstetric history on file.      Home Medications    Prior to Admission medications   Medication Sig Start Date End Date Taking? Authorizing Provider  cyclobenzaprine (FLEXERIL) 5 MG tablet Take 1-2 tablets (5-10 mg total) by mouth 3 (three) times daily as needed for muscle spasms. 03/11/18   Duanne Guess, PA-C  diclofenac sodium (VOLTAREN) 1 % GEL Apply 2 g topically 4 (four) times daily. 02/15/19   Camil Wilhelmsen, Jarrett Soho, PA-C  EPINEPHrine 0.3 mg/0.3 mL IJ SOAJ injection Inject one device into the side of the thigh for severe allergic reactions.  May repeat with second device in 5-10 minutes if needed.  After using call 9-11. 09/28/18   Lorin Glass, PA-C  naproxen  (NAPROSYN) 500 MG tablet Take 1 tablet (500 mg total) by mouth 2 (two) times daily with a meal. 03/11/18 03/11/19  Duanne Guess, PA-C    Family History No family history on file.  Social History Social History   Tobacco Use  . Smoking status: Never Smoker  . Smokeless tobacco: Never Used  Substance Use Topics  . Alcohol use: No  . Drug use: Yes    Types: Marijuana    Comment: last smoked yesterday     Allergies   Fish allergy, Motrin [ibuprofen], Peanut oil, and Yeast-related products   Review of Systems Review of Systems  Constitutional: Negative for chills and fever.  Gastrointestinal: Negative for nausea and vomiting.  Musculoskeletal: Positive for arthralgias and joint swelling. Negative for back pain, neck pain and neck stiffness.  Skin: Negative for wound.  Neurological: Negative for numbness.  Hematological: Does not bruise/bleed easily.  Psychiatric/Behavioral: The patient is not nervous/anxious.   All other systems reviewed and are negative.    Physical Exam Updated Vital Signs BP 121/83   Pulse 72   Temp 98.5 F (36.9 C) (Oral)   Resp 18   Ht 5\' 3"  (1.6 m)   Wt 72.6 kg   LMP 01/15/2019 (Exact Date) Comment: pt shielded  SpO2 99%   BMI 28.34 kg/m   Physical Exam Vitals signs and nursing note reviewed.  Constitutional:  General: She is not in acute distress.    Appearance: She is well-developed. She is not diaphoretic.  HENT:     Head: Normocephalic and atraumatic.  Eyes:     Conjunctiva/sclera: Conjunctivae normal.  Neck:     Musculoskeletal: Normal range of motion.  Cardiovascular:     Rate and Rhythm: Normal rate and regular rhythm.     Comments: Capillary refill < 3 sec Pulmonary:     Effort: Pulmonary effort is normal.     Breath sounds: Normal breath sounds.  Musculoskeletal:        General: Tenderness present.     Left hip: Normal.     Left knee: She exhibits swelling and effusion. She exhibits normal range of motion, no  ecchymosis, no deformity, no laceration, no erythema, normal alignment and normal patellar mobility. Tenderness ( generalized) found.     Left ankle: Normal.  Skin:    General: Skin is warm and dry.     Comments: No tenting of the skin  Neurological:     Mental Status: She is alert.     Coordination: Coordination normal.     Comments: Sensation intact to normal touch Strength 5/5 in the LLE      ED Treatments / Results   Radiology Dg Knee Complete 4 Views Left  Result Date: 02/15/2019 CLINICAL DATA:  Initial evaluation for acute pain and swelling. EXAM: LEFT KNEE - COMPLETE 4+ VIEW COMPARISON:  None. FINDINGS: No evidence of fracture, dislocation, or joint effusion. No evidence of arthropathy or other focal bone abnormality. Soft tissues are unremarkable. IMPRESSION: Negative. Electronically Signed   By: Rise MuBenjamin  McClintock M.D.   On: 02/15/2019 04:25    Procedures Procedures (including critical care time)  Medications Ordered in ED Medications - No data to display   Initial Impression / Assessment and Plan / ED Course  I have reviewed the triage vital signs and the nursing notes.  Pertinent labs & imaging results that were available during my care of the patient were reviewed by me and considered in my medical decision making (see chart for details).        Patient X-Ray negative for obvious fracture or dislocation.  No clinical evidence of septic joint.  Pt advised to follow up with orthopedics if symptoms persist for further evaluation and treatment. Patient given brace while in ED, conservative therapy recommended and discussed. Patient will be dc home & is agreeable with above plan.  Of note, patient reported allergy to Motrin previously and states she has been taking Ibuprofen this week without side effect.  She reports previously she noticed that her foot would sometimes swell and she would get a yeast infection when she took Motrin.  No difficulty breathing, rash, GI  symptoms.  Long discussion with patient about Voltaren usage and same classification as ibuprofen.  She is to try this medication however if she experiences worsening swelling, rash or any other symptoms she is to stop immediately.  Patient states understanding.   Final Clinical Impressions(s) / ED Diagnoses   Final diagnoses:  Acute pain of left knee    ED Discharge Orders         Ordered    diclofenac sodium (VOLTAREN) 1 % GEL  4 times daily     02/15/19 0519           Gloriann Riede, Dahlia ClientHannah, PA-C 02/15/19 [id number]    Wickline, Donald, MD 02/15/19 [phone]

## 2019-02-15 NOTE — ED Notes (Addendum)
Patient transported to X-ray 

## 2019-02-15 NOTE — Discharge Instructions (Addendum)
1. Medications: Tylenol for pain control, Voltaren as antiinflammatory; usual home medications 2. Treatment: rest, ice, elevate and use brace, drink plenty of fluids, gentle stretching 3. Follow Up: Please followup with orthopedics as directed or your PCP in 1 week if no improvement for discussion of your diagnoses and further evaluation after today's visit; if you do not have a primary care doctor use the resource guide provided to find one; Please return to the ER for worsening symptoms or other concerns

## 2019-02-15 NOTE — ED Triage Notes (Signed)
Pt reports knee pain and swelling for the past 2 weeks.

## 2019-08-21 ENCOUNTER — Emergency Department (HOSPITAL_COMMUNITY): Admission: EM | Admit: 2019-08-21 | Discharge: 2019-08-21 | Discharge status: Against Medical Advice | Payer: Self-pay

## 2019-08-21 ENCOUNTER — Other Ambulatory Visit: Payer: Self-pay

## 2019-08-27 ENCOUNTER — Encounter (HOSPITAL_COMMUNITY): Payer: Self-pay | Admitting: Emergency Medicine

## 2019-08-27 ENCOUNTER — Emergency Department (HOSPITAL_COMMUNITY)
Admission: EM | Admit: 2019-08-27 | Discharge: 2019-08-27 | Disposition: A | Discharge status: Home / Self Care | Payer: Self-pay | Attending: Emergency Medicine | Admitting: Emergency Medicine

## 2019-08-27 DIAGNOSIS — Z91010 Allergy to peanuts: Secondary | ICD-10-CM | POA: Insufficient documentation

## 2019-08-27 DIAGNOSIS — T7840XA Allergy, unspecified, initial encounter: Secondary | ICD-10-CM | POA: Insufficient documentation

## 2019-08-27 MED ORDER — FAMOTIDINE IN NACL 20-0.9 MG/50ML-% IV SOLN
20.0000 mg | Freq: Once | INTRAVENOUS | Status: AC
  Administered 2019-08-27: 06:00:00 20 mg via INTRAVENOUS
  Filled 2019-08-27: qty 50

## 2019-08-27 MED ORDER — EPINEPHRINE 0.3 MG/0.3ML IJ SOAJ
0.3000 mg | Freq: Once | INTRAMUSCULAR | Status: AC
  Administered 2019-08-27: 0.3 mg via INTRAMUSCULAR
  Filled 2019-08-27: qty 0.3

## 2019-08-27 MED ORDER — DIPHENHYDRAMINE HCL 50 MG/ML IJ SOLN
25.0000 mg | Freq: Once | INTRAMUSCULAR | Status: AC
  Administered 2019-08-27: 25 mg via INTRAVENOUS
  Filled 2019-08-27: qty 1

## 2019-08-27 MED ORDER — METHYLPREDNISOLONE SODIUM SUCC 125 MG IJ SOLR
125.0000 mg | Freq: Once | INTRAMUSCULAR | Status: AC
  Administered 2019-08-27: 125 mg via INTRAVENOUS
  Filled 2019-08-27: qty 2

## 2019-08-27 MED ORDER — EPINEPHRINE 0.3 MG/0.3ML IJ SOAJ: 0.3000 mg | INTRAMUSCULAR | 1 refills | Status: AC | PRN

## 2019-08-27 MED ORDER — PREDNISONE 20 MG PO TABS: 40.0000 mg | ORAL_TABLET | Freq: Every day | ORAL | 0 refills | Status: AC

## 2019-08-27 NOTE — ED Provider Notes (Signed)
Patient checked out at 8 AM.  After 4 hours of observation on repeat evaluation bilateral eyelids are still swollen and left lower lip but patient states it is not any worse.  She has no uvular edema or tongue swelling.  Will discharge home with a new EpiPen and steroids.   Gwyneth Sprout, MD 08/27/19 1058

## 2019-08-27 NOTE — ED Triage Notes (Signed)
Patient here from home with complaints of allergic reaction. Facial swelling noted increased in bilateral eyes and lips. Reports "a lot of allergies". States that she thinks that she is allergic to the cold meds she took at 1am.

## 2019-08-27 NOTE — Discharge Instructions (Signed)
Continue to use the liquid you use for your allergies every 6 hours.  Also you can try Ocean Nasal Spray or Saline spray for your congestion.  If the swelling gets worse or you start having any trouble breathing use the EpiPen and return immediately

## 2019-08-27 NOTE — ED Provider Notes (Signed)
[place] DEPT Provider Note   CSN: [id number] Arrival date & time: 08/27/19  1194    History Chief Complaint  Patient presents with  . Allergic Reaction    [name] is a 24 y.o. female.  The history is provided by the patient.  Allergic Reaction She has history of allergies to multiple things including peanut oil, fish, ibuprofen, yeast and comes in because of facial swelling which she feels is likely an allergic reaction.  She states that she took some Alka-Seltzer plus at about 10 PM and went to sleep about 1 AM.  She woke up at 2 AM with her face itching and swollen.  She has taken diphenhydramine liquid at home without any improvement.  Face has gotten progressively more swollen.  She is having some slight difficulty breathing and swallowing.  She did not use her EpiPen.  History reviewed. No pertinent past medical history.  There are no problems to display for this patient.   History reviewed. No pertinent surgical history.   OB History   No obstetric history on file.     No family history on file.  Social History   Tobacco Use  . Smoking status: Never Smoker  . Smokeless tobacco: Never Used  Substance Use Topics  . Alcohol use: No  . Drug use: Yes    Types: Marijuana    Comment: last smoked yesterday    Home Medications Prior to Admission medications   Medication Sig Start Date End Date Taking? Authorizing Provider  cyclobenzaprine (FLEXERIL) 5 MG tablet Take 1-2 tablets (5-10 mg total) by mouth 3 (three) times daily as needed for muscle spasms. 03/11/18   Duanne Guess, PA-C  diclofenac sodium (VOLTAREN) 1 % GEL Apply 2 g topically 4 (four) times daily. 02/15/19   Muthersbaugh, Jarrett Soho, PA-C  EPINEPHrine 0.3 mg/0.3 mL IJ SOAJ injection Inject one device into the side of the thigh for severe allergic reactions.  May repeat with second device in 5-10 minutes if needed.  After using call 9-11. 09/28/18   Lorin Glass,  PA-C    Allergies    Fish allergy, Motrin [ibuprofen], Peanut oil, and Yeast-related products  Review of Systems   Review of Systems  All other systems reviewed and are negative.   Physical Exam Updated Vital Signs BP 124/88 (BP Location: Left Arm)   Pulse 92   Temp 98.4 F (36.9 C) (Oral)   Resp 10   SpO2 97%   Physical Exam Vitals and nursing note reviewed.   24 year old female, resting comfortably and in no acute distress. Vital signs are normal. Oxygen saturation is 97%, which is normal. Head is normocephalic and atraumatic. PERRLA, EOMI. moderate periorbital edema is present bilaterally.  There is moderate swelling of the lip which is worse on the left side.  Uvula has mild edema but there is no pooling of secretions and phonation is normal. Neck is nontender and supple without adenopathy or JVD. Back is nontender and there is no CVA tenderness. Lungs are clear without rales, wheezes, or rhonchi. Chest is nontender. Heart has regular rate and rhythm without murmur. Abdomen is soft, flat, nontender without masses or hepatosplenomegaly and peristalsis is normoactive. Extremities have no cyanosis or edema, full range of motion is present. Skin is warm and dry without rash. Neurologic: Mental status is normal, cranial nerves are intact, there are no motor or sensory deficits.   ED Results / Procedures / Treatments    Procedures Procedures  CRITICAL CARE Performed by: Dione Booze Total critical care time: 60 minutes Critical care time was exclusive of separately billable procedures and treating other patients. Critical care was necessary to treat or prevent imminent or life-threatening deterioration. Critical care was time spent personally by me on the following activities: development of treatment plan with patient and/or surrogate as well as nursing, discussions with consultants, evaluation of patient's response to treatment, examination of patient, obtaining history  from patient or surrogate, ordering and performing treatments and interventions, ordering and review of laboratory studies, ordering and review of radiographic studies, pulse oximetry and re-evaluation of patient's condition.  Medications Ordered in ED Medications  EPINEPHrine (EPI-PEN) injection 0.3 mg (0.3 mg Intramuscular Given 08/27/19 0620)  diphenhydrAMINE (BENADRYL) injection 25 mg (25 mg Intravenous Given 08/27/19 0621)  famotidine (PEPCID) IVPB 20 mg premix (0 mg Intravenous Stopped 08/27/19 0657)  methylPREDNISolone sodium succinate (SOLU-MEDROL) 125 mg/2 mL injection 125 mg (125 mg Intravenous Given 08/27/19 8916)    ED Course  I have reviewed the triage vital signs and the nursing notes.  MDM Rules/Calculators/A&P Acute allergic reaction.  She is given intramuscular epinephrine and intravenous diphenhydramine, famotidine, methylprednisolone.  Old records are reviewed, and she has a prior ED visit for angioedema.  Her lip swelling would be consistent with angioedema, but the overall presentation is more consistent with allergy.  7:37 AM Patient is resting comfortably, no significant change in exam.  Will need to continue observation.  Case is signed out to Dr. Anitra Lauth.  Final Clinical Impression(s) / ED Diagnoses Final diagnoses:  Allergic reaction, initial encounter    Rx / DC Orders ED Discharge Orders    None       Dione Booze, MD 08/27/19 [phone]

## 2020-05-10 IMAGING — DX LEFT KNEE - COMPLETE 4+ VIEW
4 series · 4 of 4 positions shown · non-contrast
Comparison: None.

CLINICAL DATA: Initial evaluation for acute pain and swelling.

EXAM:
LEFT KNEE - COMPLETE 4+ VIEW

[knee ap]
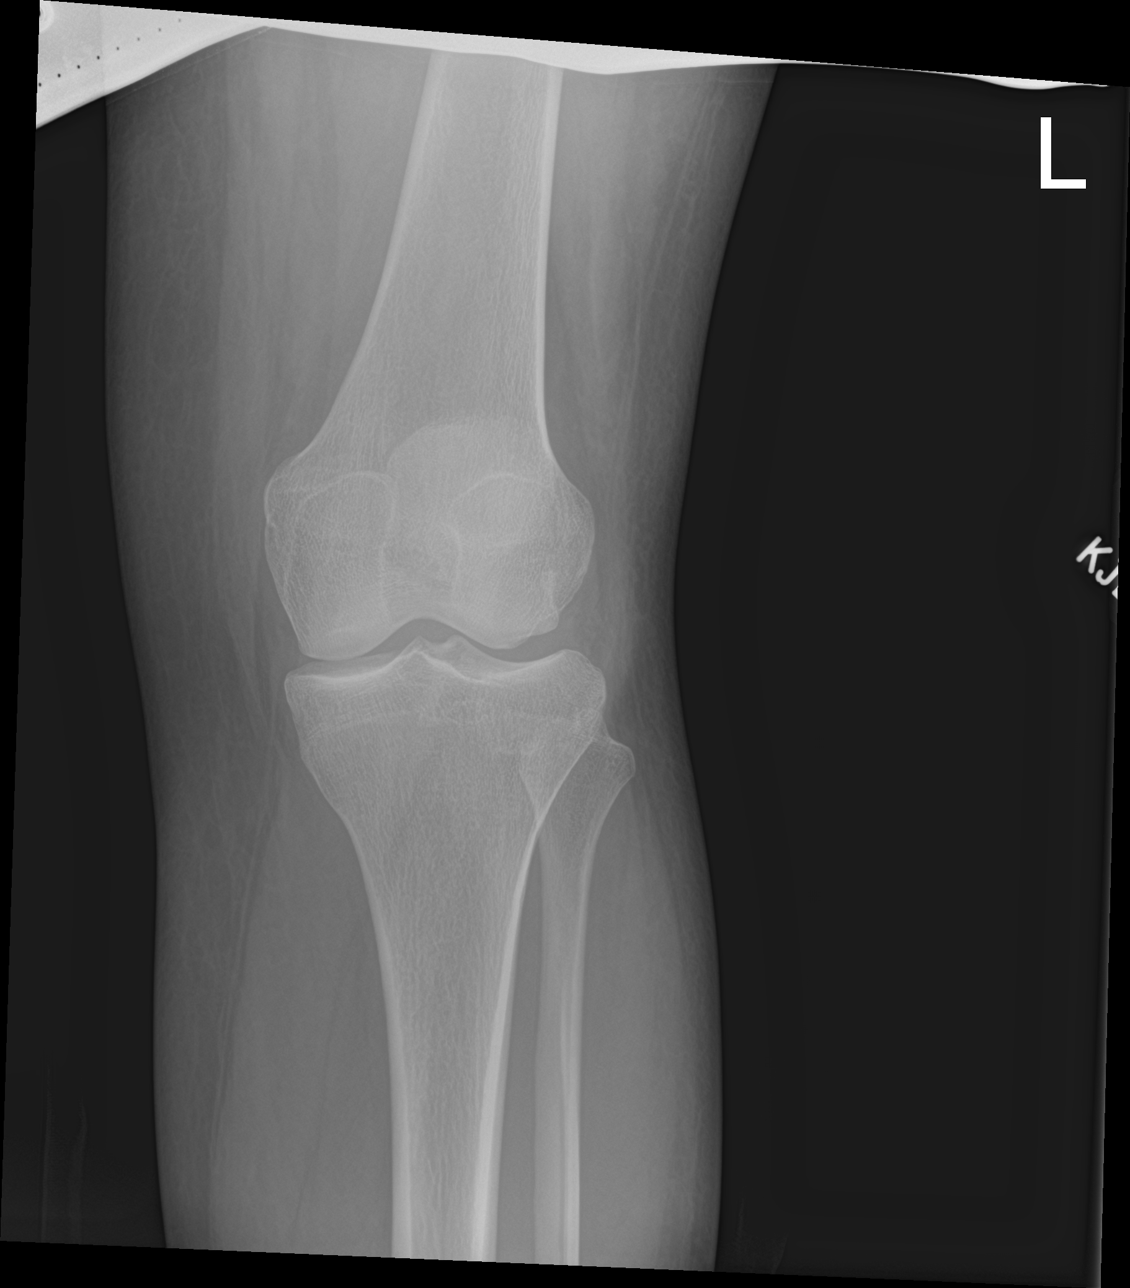

[knee lat]
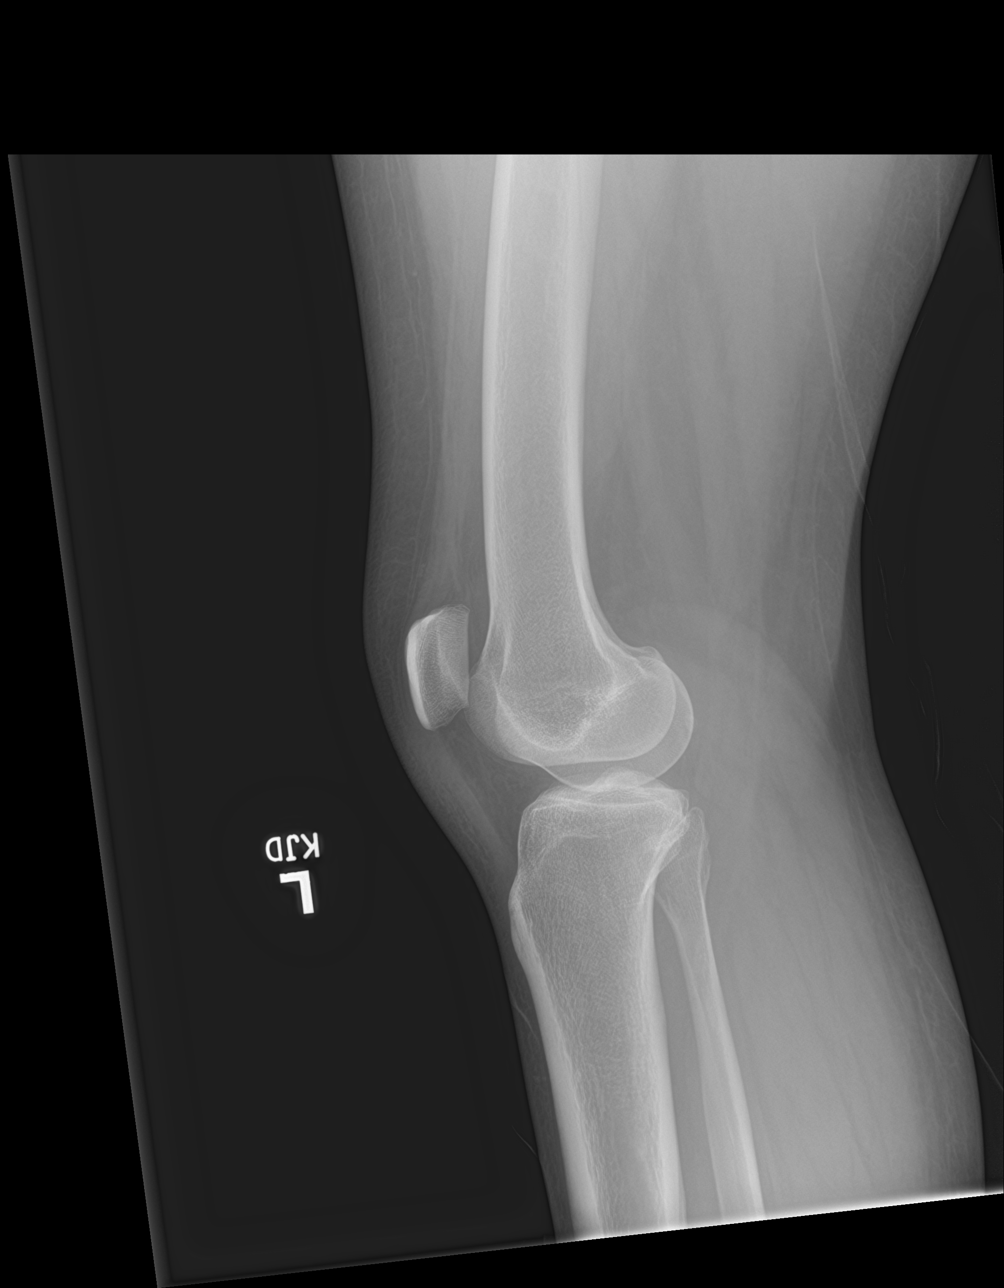

[knee obl (1 of 2)]
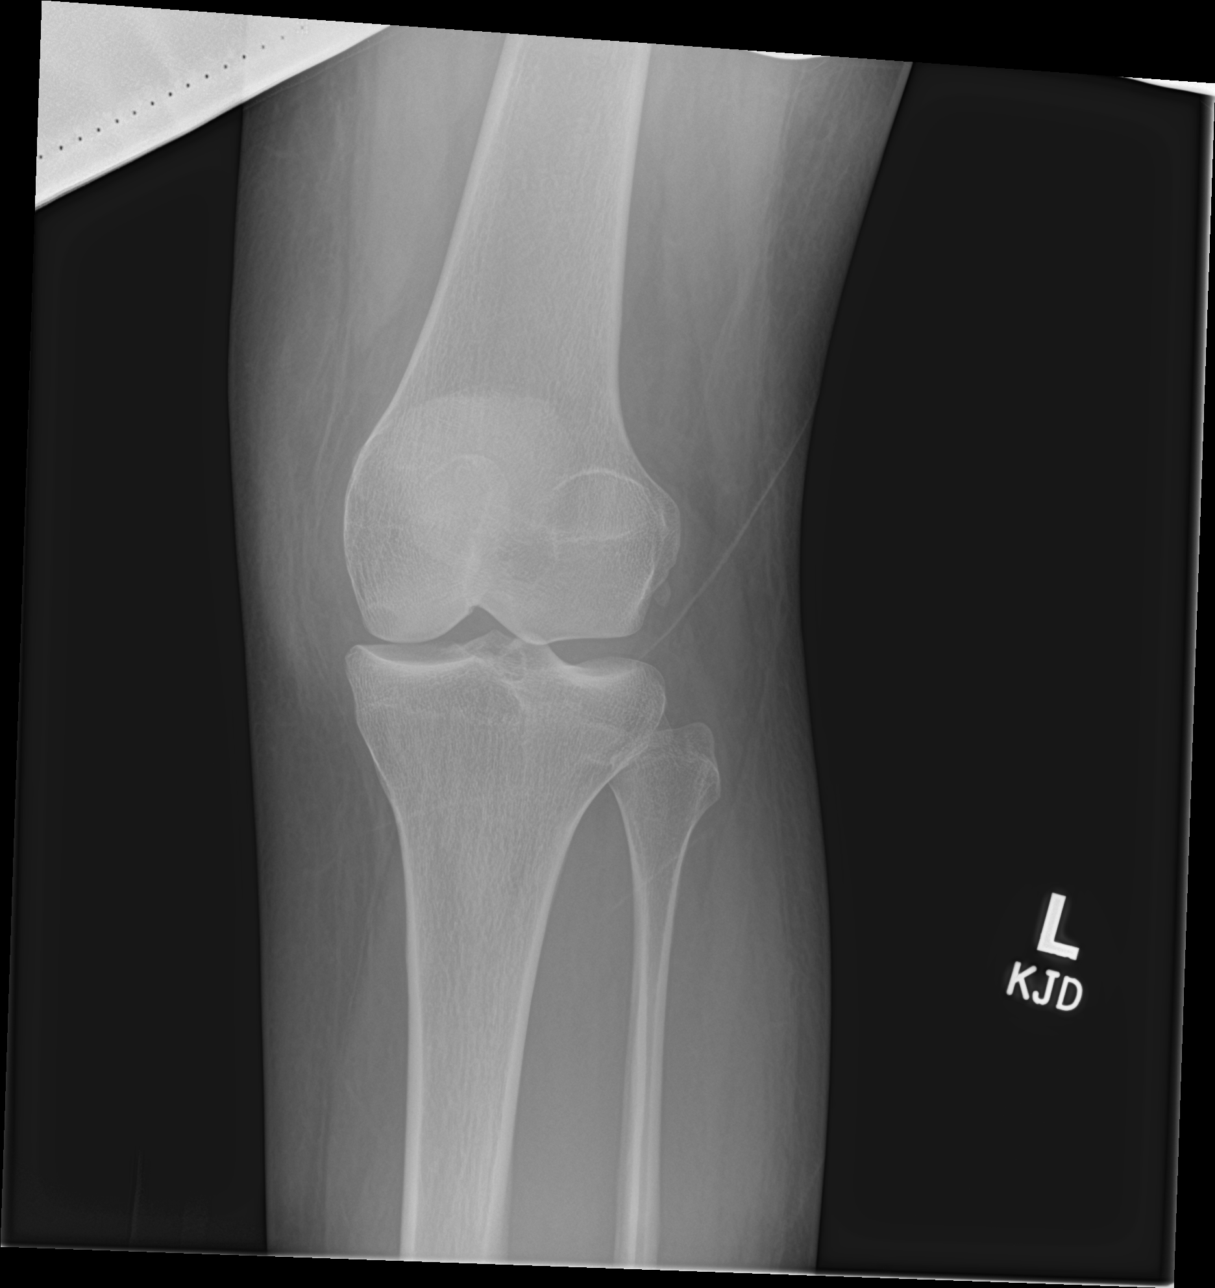

[knee obl (2 of 2)]
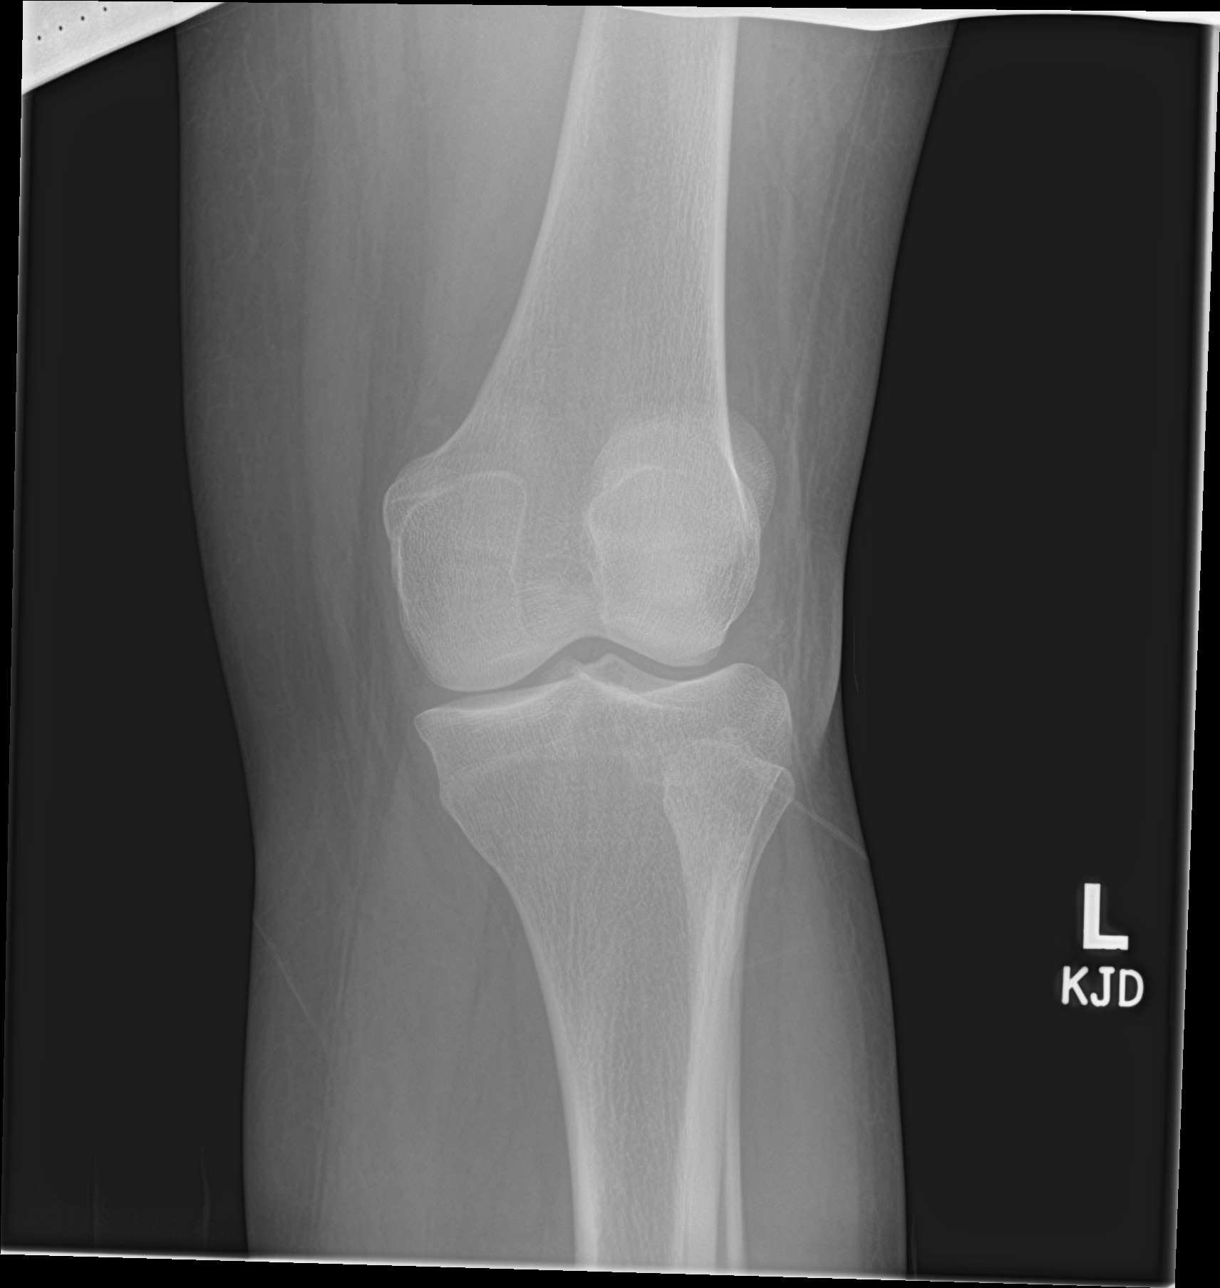

[4 of 4 positions shown; findings below may reference images not displayed]

FINDINGS: No evidence of fracture, dislocation, or joint effusion. No evidence
of arthropathy or other focal bone abnormality. Soft tissues are
unremarkable.
IMPRESSION: Negative.

## 2021-03-12 ENCOUNTER — Encounter (HOSPITAL_COMMUNITY): Payer: Self-pay | Admitting: Emergency Medicine

## 2021-03-12 ENCOUNTER — Other Ambulatory Visit: Payer: Self-pay

## 2021-03-12 ENCOUNTER — Emergency Department (HOSPITAL_COMMUNITY)
Admission: EM | Admit: 2021-03-12 | Discharge: 2021-03-12 | Disposition: A | Discharge status: Home / Self Care | Payer: Self-pay | Attending: Emergency Medicine | Admitting: Emergency Medicine

## 2021-03-12 DIAGNOSIS — H7291 Unspecified perforation of tympanic membrane, right ear: Secondary | ICD-10-CM | POA: Insufficient documentation

## 2021-03-12 NOTE — ED Triage Notes (Signed)
Patient presents after she was cleaning her ear this morning with a q-tip, and the q-tip accidentally was forced into her ear. Patient endorses bleeding and muffled hearing. No active bleeding. Some bleeding noted on TM.

## 2021-03-12 NOTE — Discharge Instructions (Addendum)
Please follow up with Dr. Jenne [facility] for further evaluation of your perforated ear drum  You can take OTC pain medication as needed. Please verify what you are allergic to prior to taking OTC medications.   Return to the ED for any new/worsening symptoms

## 2021-03-12 NOTE — ED Provider Notes (Signed)
[facility]-EMERGENCY DEPT Provider Note   CSN: [id number] Arrival date & time: 03/12/21  2123     History Chief Complaint  Patient presents with   Otalgia    [name] is a 25 y.o. female who presents to the ED Today with complaints of gradual onset, constant, achy, R ear pain s/p trauma to the ear earlier today.  She reports she was cleaning out her ear with a Q-tip and states she "forgot" that it was in her ear when she put a headband over her head and feels like it got lodged into her ear.  She states she removed and had some mild pain.  She applied warm compresses to the area without much relief.  She states that after applying warm compresses she noticed a small amount of bleeding to the ear.  She presents to the ED today for same.  She states she is unsure regarding her tetanus status however she is adamant she does not want a shot at this time.  He does complain of some muffled hearing to the ear as well.  She denies any fevers or chills.  No other complaints.  The history is provided by the patient and medical records.      History reviewed. No pertinent past medical history.  There are no problems to display for this patient.   History reviewed. No pertinent surgical history.   OB History   No obstetric history on file.     No family history on file.  Social History   Tobacco Use   Smoking status: Never   Smokeless tobacco: Never  Vaping Use   Vaping Use: Never used  Substance Use Topics   Alcohol use: No   Drug use: Yes    Types: Marijuana    Comment: last smoked yesterday    Home Medications Prior to Admission medications   Medication Sig Start Date End Date Taking? Authorizing Provider  cyclobenzaprine (FLEXERIL) 5 MG tablet Take 1-2 tablets (5-10 mg total) by mouth 3 (three) times daily as needed for muscle spasms. 03/11/18   Evon Slack, PA-C  diclofenac sodium (VOLTAREN) 1 % GEL Apply 2 g topically 4 (four) times daily.  02/15/19   Muthersbaugh, Dahlia Client, PA-C  EPINEPHrine (EPIPEN 2-PAK) 0.3 mg/0.3 mL IJ SOAJ injection Inject 0.3 mLs (0.3 mg total) into the muscle as needed for anaphylaxis. 08/27/19   Gwyneth Sprout, MD  predniSONE (DELTASONE) 20 MG tablet Take 2 tablets (40 mg total) by mouth daily. 08/27/19   Gwyneth Sprout, MD    Allergies    Acetaminophen, Fish allergy, Motrin [ibuprofen], Peanut oil, and Yeast-related products  Review of Systems   Review of Systems  Constitutional:  Negative for chills and fever.  HENT:  Positive for ear discharge (blood), ear pain and hearing loss.   All other systems reviewed and are negative.  Physical Exam Updated Vital Signs BP 119/84 (BP Location: Left Arm)   Pulse 70   Temp 98.1 F (36.7 C) (Oral)   Resp 16   Ht 5\' 5"  (1.651 m)   Wt 83.9 kg   SpO2 100%   BMI 30.79 kg/m   Physical Exam Vitals and nursing note reviewed.  Constitutional:      Appearance: She is not ill-appearing.  HENT:     Head: Normocephalic and atraumatic.     Right Ear: Tympanic membrane is perforated.     Left Ear: Tympanic membrane normal.     Ears:  Comments: Small perforation noted to the R TM with bleeding Eyes:     Conjunctiva/sclera: Conjunctivae normal.  Cardiovascular:     Rate and Rhythm: Normal rate and regular rhythm.  Pulmonary:     Effort: Pulmonary effort is normal.     Breath sounds: Normal breath sounds.  Skin:    General: Skin is warm and dry.     Coloration: Skin is not jaundiced.  Neurological:     Mental Status: She is alert.    ED Results / Procedures / Treatments   Labs (all labs ordered are listed, but only abnormal results are displayed) Labs Reviewed - No data to display  EKG None  Radiology No results found.  Procedures Procedures   Medications Ordered in ED Medications - No data to display  ED Course  I have reviewed the triage vital signs and the nursing notes.  Pertinent labs & imaging results that were available  during my care of the patient were reviewed by me and considered in my medical decision making (see chart for details).    MDM Rules/Calculators/A&P                           25 year old female who presents to the ED today with complaint of right ear pain secondary to trauma to the ear from the Q-tip.  Has had a small amount of bleeding to same.  On arrival to the ED today patient appears to be in no acute distress.  On my exam she is noted to have a small perforated right tympanic membrane with bleeding in place.  She is unsure regarding tetanus status however she is adamant she does not want a shot at this time.  I did had lengthy discussion with her for same.  She states that she would like to think about it and do her "research" as she is weary of vaccines at this time.  Feel this is appropriate today.  I have low suspicion that she would obtain a tetanus from a Q-tip.  She is stable for discharge at this time with plans for ENT follow-up.   This note was prepared using Dragon voice recognition software and may include unintentional dictation errors due to the inherent limitations of voice recognition software.   Final Clinical Impression(s) / ED Diagnoses Final diagnoses:  Perforation of right tympanic membrane    Rx / DC Orders ED Discharge Orders     None        Discharge Instructions      Please follow up with Dr. Jenne [facility] for further evaluation of your perforated ear drum  You can take OTC pain medication as needed. Please verify what you are allergic to prior to taking OTC medications.   Return to the ED for any new/worsening symptoms       Tanda Rockers, Cordelia Poche 03/12/21 2242    Alvira Monday, MD 03/14/21 1040

## 2022-09-13 DIAGNOSIS — Z113 Encounter for screening for infections with a predominantly sexual mode of transmission: Secondary | ICD-10-CM | POA: Diagnosis not present

## 2022-09-13 DIAGNOSIS — Z0000 Encounter for general adult medical examination without abnormal findings: Secondary | ICD-10-CM | POA: Diagnosis not present

## 2024-03-05 ENCOUNTER — Encounter (HOSPITAL_COMMUNITY): Payer: Self-pay

## 2024-03-05 ENCOUNTER — Emergency Department (HOSPITAL_COMMUNITY): Payer: Self-pay

## 2024-03-05 ENCOUNTER — Other Ambulatory Visit: Payer: Self-pay

## 2024-03-05 ENCOUNTER — Emergency Department (HOSPITAL_COMMUNITY)
Admission: EM | Admit: 2024-03-05 | Discharge: 2024-03-06 | Disposition: A | Discharge status: Home / Self Care | Payer: Self-pay | Attending: Emergency Medicine | Admitting: Emergency Medicine

## 2024-03-05 DIAGNOSIS — R0789 Other chest pain: Secondary | ICD-10-CM | POA: Insufficient documentation

## 2024-03-05 DIAGNOSIS — K30 Functional dyspepsia: Secondary | ICD-10-CM | POA: Insufficient documentation

## 2024-03-05 LAB — CBC WITH DIFFERENTIAL/PLATELET
Abs Immature Granulocytes: 0.01 K/uL (ref 0.00–0.07)
Basophils Absolute: 0 K/uL (ref 0.0–0.1)
Basophils Relative: 1 %
Eosinophils Absolute: 0.2 K/uL (ref 0.0–0.5)
Eosinophils Relative: 4 %
HCT: 38.9 % (ref 36.0–46.0)
Hemoglobin: 12.4 g/dL (ref 12.0–15.0)
Immature Granulocytes: 0 %
Lymphocytes Relative: 36 %
Lymphs Abs: 1.6 K/uL (ref 0.7–4.0)
MCH: 30.8 pg (ref 26.0–34.0)
MCHC: 31.9 g/dL (ref 30.0–36.0)
MCV: 96.8 fL (ref 80.0–100.0)
Monocytes Absolute: 0.4 K/uL (ref 0.1–1.0)
Monocytes Relative: 9 %
Neutro Abs: 2.1 K/uL (ref 1.7–7.7)
Neutrophils Relative %: 50 %
Platelets: 302 K/uL (ref 150–400)
RBC: 4.02 MIL/uL (ref 3.87–5.11)
RDW: 13.2 % (ref 11.5–15.5)
WBC: 4.3 K/uL (ref 4.0–10.5)
nRBC: 0 % (ref 0.0–0.2)

## 2024-03-05 LAB — BASIC METABOLIC PANEL WITH GFR
Anion gap: 6 (ref 5–15)
BUN: 17 mg/dL (ref 6–20)
CO2: 22 mmol/L (ref 22–32)
Calcium: 8.9 mg/dL (ref 8.9–10.3)
Chloride: 108 mmol/L (ref 98–111)
Creatinine, Ser: 0.69 mg/dL (ref 0.44–1.00)
GFR, Estimated: >60 mL/min (ref >60)
Glucose, Bld: 90 mg/dL (ref 70–99)
Potassium: 4 mmol/L (ref 3.5–5.1)
Sodium: 136 mmol/L (ref 135–145)

## 2024-03-05 LAB — PREGNANCY, URINE: Preg Test, Ur: NEGATIVE

## 2024-03-05 MED ORDER — METHOCARBAMOL 500 MG PO TABS
500.0000 mg | ORAL_TABLET | Freq: Once | ORAL | Status: AC
  Administered 2024-03-05: 500 mg via ORAL
  Filled 2024-03-05: qty 1

## 2024-03-05 MED ORDER — FAMOTIDINE 20 MG PO TABS
20.0000 mg | ORAL_TABLET | Freq: Once | ORAL | Status: AC
  Administered 2024-03-05: 20 mg via ORAL
  Filled 2024-03-05: qty 1

## 2024-03-05 MED ORDER — ALUM & MAG HYDROXIDE-SIMETH 200-200-20 MG/5ML PO SUSP
30.0000 mL | Freq: Once | ORAL | Status: AC
  Administered 2024-03-05: 30 mL via ORAL
  Filled 2024-03-05: qty 30

## 2024-03-05 NOTE — ED Provider Triage Note (Signed)
 Emergency Medicine Provider Triage Evaluation Note  [name] , a 28 y.o. female  was evaluated in triage.  Pt complains of L sided chest pain x 4 days, intermittent, worse with lifting L arm. No heart Hx in past. No exertional Sx. 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   Not on birth control.   Denies fever, headache, vision changes, shortness of breath, abdominal pain, n/v/d, dysuria, leg swelling.   Review of Systems  Positive: N/a Negative: N/a  Physical Exam  BP 104/79   Pulse 79   Temp 98.9 F (37.2 C)   Resp 18   Ht 5' 5 (1.651 m)   Wt 81.6 kg   SpO2 100%   BMI 29.95 kg/m  Gen:   Awake, no distress   Resp:  Normal effort  MSK:   Moves extremities without difficulty  Other:    Medical Decision Making  Medically screening exam initiated at 6:06 PM.  Appropriate orders placed.  [name] was informed that the remainder of the evaluation will be completed by another provider, this initial triage assessment does not replace that evaluation, and the importance of remaining in the ED until their evaluation is complete.     Beola Terrall G[place], [place] 03/05/24 [phone]

## 2024-03-05 NOTE — ED Triage Notes (Signed)
 Chest pain since Saturday, pt states when she moves her arm up,her chest begins hurting. Pt states she ate chicken on Saturday and feels like she swallowed a piece and it went down the wrong way and has been having this pain since.

## 2024-03-06 LAB — TROPONIN I (HIGH SENSITIVITY): Troponin I (High Sensitivity): <2 ng/L (ref <18)

## 2024-03-06 LAB — D-DIMER, QUANTITATIVE: D-Dimer, Quant: <0.27 ug{FEU}/mL (ref 0.00–0.50)

## 2024-03-06 MED ORDER — OMEPRAZOLE 20 MG PO CPDR: 20.0000 mg | DELAYED_RELEASE_CAPSULE | Freq: Every day | ORAL | 0 refills | Status: AC

## 2024-03-06 MED ORDER — METHOCARBAMOL 500 MG PO TABS: 500.0000 mg | ORAL_TABLET | Freq: Two times a day (BID) | ORAL | 0 refills | Status: AC

## 2024-03-06 MED ORDER — SUCRALFATE 1 G PO TABS: 1.0000 g | ORAL_TABLET | Freq: Three times a day (TID) | ORAL | 0 refills | Status: AC

## 2024-03-06 NOTE — ED Provider Notes (Signed)
 [facility]-EMERGENCY DEPT [facility] Emergency Department Provider Note MRN:  [id number]  Arrival date & time: 03/06/24     Chief Complaint   Chest Pain   History of Present Illness   [name] is a 28 y.o. year-old female presents to the ED with chief complaint of chest pain.  She states she has been having intermittent left-sided chest pain for the past 4 days.  She states that it is worsened when she lifts her left arm.  She denies any heart or lung problems.  Denies any exertional symptoms.  She states that she may have strained something while at work.  She also notes that she has been having a lot of belching, which also improves her symptoms after belching.  She tried taking a Gas-X, but has not tried any other medications or treatments.  She denies any cough or fever.  History provided by patient.   Review of Systems  Pertinent positive and negative review of systems noted in HPI.    Physical Exam   Vitals:   03/05/24 2234 03/06/24 0027  BP: 109/65 (!) 126/90  Pulse: 66 (!) 56  Resp: 18 18  Temp: (!) 97.5 F (36.4 C) 97.8 F (36.6 C)  SpO2: 100% 100%    CONSTITUTIONAL:  non toxic-appearing, NAD NEURO:  Alert and oriented x 3, CN 3-12 grossly intact EYES:  eyes equal and reactive ENT/NECK:  Supple, no stridor  CARDIO:  normal rate, regular rhythm, appears well-perfused  PULM:  No respiratory distress, CTAB GI/GU:  non-distended, no focal tenderness MSK/SPINE:  No gross deformities, no edema, moves all extremities, no calf tenderness SKIN:  no rash, atraumatic   *Additional and/or pertinent findings included in MDM below  Diagnostic and Interventional Summary    EKG Interpretation Date/Time:    Ventricular Rate:    PR Interval:    QRS Duration:    QT Interval:    QTC Calculation:   R Axis:      Text Interpretation:         Labs Reviewed  CBC WITH DIFFERENTIAL/PLATELET  BASIC METABOLIC PANEL WITH GFR  PREGNANCY, URINE  D-DIMER, QUANTITATIVE   TROPONIN I (HIGH SENSITIVITY)    DG Chest 2 View  Final Result      Medications  methocarbamol  (ROBAXIN ) tablet 500 mg (500 mg Oral Given 03/05/24 2333)  alum & mag hydroxide-simeth (MAALOX/MYLANTA) 200-200-20 MG/5ML suspension 30 mL (30 mLs Oral Given 03/05/24 2333)  famotidine  (PEPCID ) tablet 20 mg (20 mg Oral Given 03/05/24 2333)     Procedures  /  Critical Care Procedures  ED Course and Medical Decision Making  I have reviewed the triage vital signs, the nursing notes, and pertinent available records from the EMR.  Social Determinants Affecting Complexity of Care: Patient has no clinically significant social determinants affecting this chief complaint..   ED Course:    Medical Decision Making Patient here with chest pain that started about 4 days ago.  It is reproducible with arm movement.  She has not had cough or fever.  EKG shows no acute ischemic changes.  Troponin is normal.  Given that symptoms have been ongoing for about 4 days and a completely negative troponin, I do not think we need to repeat.  D-dimer was ordered because patient said she had some calf pain the other night, but does not have any now.  D-dimer was negative.  Patient is not hypoxic, not tachycardic.  I have a very low suspicion for PE.  Chest x-ray negative  for pneumonia, pleural effusion, or pneumothorax.  Patient does complain of some gas and belching.  Her symptoms could be multifactorial, GI, musculoskeletal.  Will try treatment with muscle relaxer and with PPI and Carafate .  Recommend outpatient follow-up with PCP.  Patient understands and agrees with the plan.  She is stable ready for discharge.  Amount and/or Complexity of Data Reviewed Labs: ordered.  Risk OTC drugs. Prescription drug management.         Consultants: No consultations were needed in caring for this patient.   Treatment and Plan: I considered admission due to patient's initial presentation, but after considering the  examination and diagnostic results, patient will not require admission and can be discharged with outpatient follow-up.    Final Clinical Impressions(s) / ED Diagnoses     ICD-10-CM   1. Chest wall pain  R07.89     2. Indigestion  K30       ED Discharge Orders          Ordered    sucralfate  (CARAFATE ) 1 g tablet  3 times daily with meals & bedtime        03/06/24 0034    methocarbamol  (ROBAXIN ) 500 MG tablet  2 times daily        03/06/24 0034    omeprazole  (PRILOSEC) 20 MG capsule  Daily        03/06/24 0034              Discharge Instructions Discussed with and Provided to Patient:   Discharge Instructions   None      Vicky Charleston, PA-C 03/06/24 0035    Theadore Ozell HERO, MD 03/06/24 [phone]
